# Patient Record
Sex: Male | Born: 2000 | Race: White | Hispanic: No | Marital: Single | Smoking: Never smoker
Health system: Southern US, Community
[De-identification: ages and names within clinical notes are randomized; demographics above are authoritative.]

---
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
## 2019-02-09 ENCOUNTER — Telehealth: Payer: Self-pay | Admitting: Family Medicine

## 2019-02-09 NOTE — Telephone Encounter (Signed)
Pt contacted office. Pt states he has had a fever since Thursday along with diarrhea, nausea, aches, chills, sore throat at night. Pt has been taking Tylenol every 4 hours and staying at home. Pt states his fever has been between 100-103. Today his temp is 101.5. spoke with provider. Provider states that patient should not go back to work until fever free for a few days, stay home and continue Tylenol. If patient develops shortness of breath, he would need to go to ER. Pt verbalized understanding. Pt is needing a work note explaining that he should not come back to work until fever free for a few days. Please advise. Thank you

## 2019-02-09 NOTE — Telephone Encounter (Signed)
Do work note thru this wk

## 2019-02-13 ENCOUNTER — Encounter: Payer: Self-pay | Admitting: Family Medicine

## 2019-12-16 ENCOUNTER — Encounter: Payer: Self-pay | Admitting: Family Medicine

## 2019-12-17 ENCOUNTER — Encounter: Payer: Self-pay | Admitting: Family Medicine
